# Patient Record
Sex: Male | Born: 1990 | Race: Black or African American | Hispanic: No | Marital: Married | Smoking: Current every day smoker
Health system: Southern US, Community
[De-identification: ages and names within clinical notes are randomized; demographics above are authoritative.]

---

## 2018-07-08 ENCOUNTER — Emergency Department (HOSPITAL_COMMUNITY): Payer: BLUE CROSS/BLUE SHIELD

## 2018-07-08 ENCOUNTER — Emergency Department (HOSPITAL_COMMUNITY)
Admission: EM | Admit: 2018-07-08 | Discharge: 2018-07-08 | Disposition: A | Discharge status: Home / Self Care | Payer: BLUE CROSS/BLUE SHIELD | Attending: Emergency Medicine | Admitting: Emergency Medicine

## 2018-07-08 ENCOUNTER — Encounter (HOSPITAL_COMMUNITY): Payer: Self-pay

## 2018-07-08 ENCOUNTER — Other Ambulatory Visit: Payer: Self-pay

## 2018-07-08 DIAGNOSIS — F17200 Nicotine dependence, unspecified, uncomplicated: Secondary | ICD-10-CM | POA: Insufficient documentation

## 2018-07-08 DIAGNOSIS — M79672 Pain in left foot: Secondary | ICD-10-CM | POA: Diagnosis not present

## 2018-07-08 MED ORDER — IBUPROFEN 200 MG PO TABS
600.0000 mg | ORAL_TABLET | Freq: Once | ORAL | Status: AC
  Administered 2018-07-08: 600 mg via ORAL
  Filled 2018-07-08: qty 3

## 2018-07-08 MED ORDER — IBUPROFEN 600 MG PO TABS: 600.0000 mg | ORAL_TABLET | Freq: Four times a day (QID) | ORAL | 0 refills | Status: DC | PRN

## 2018-07-08 NOTE — ED Notes (Signed)
Patient transported to X-ray 

## 2018-07-08 NOTE — ED Provider Notes (Signed)
[facility]-EMERGENCY DEPT Provider Note   CSN: [id number] Arrival date & time: 07/08/18  [id number]     History   Chief Complaint Chief Complaint  Patient presents with  . Foot Pain    HPI Aura CampsKyleith Buckholtz is a 27 y.o. male.  27 year old male presents with left foot pain localized to the top of his foot that is atraumatic.  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  Pain characterizes sharp and worse with any movement of his foot.  Denies any distal numbness or tingling to his toes.  No prior history of foot trauma.  Has used Aleve with temporary relief     History reviewed. No pertinent past medical history.  There are no active problems to display for this patient.   History reviewed. No pertinent surgical history.      Home Medications    Prior to Admission medications   Not on File    Family History No family history on file.  Social History Social History   Tobacco Use  . Smoking status: Current Every Day Smoker    Packs/day: 0.50  . Smokeless tobacco: Never Used  Substance Use Topics  . Alcohol use: Never    Frequency: Never  . Drug use: Yes    Types: Marijuana     Allergies   Kiwi extract and Nickel   Review of Systems Review of Systems  All other systems reviewed and are negative.    Physical Exam Updated Vital Signs BP 129/69 (BP Location: Left Arm)   Pulse 75   Temp 98 F (36.7 C) (Oral)   Resp 16   Ht 1.829 m (6')   Wt 83.9 kg   SpO2 98%   BMI 25.09 kg/m   Physical Exam  Constitutional: He is oriented to person, place, and time. He appears well-developed and well-nourished.  Non-toxic appearance. No distress.  HENT:  Head: Normocephalic and atraumatic.  Eyes: Pupils are equal, round, and reactive to light. Conjunctivae, EOM and lids are normal.  Neck: Normal range of motion. Neck supple. No tracheal deviation present. No thyroid mass present.  Cardiovascular: Normal rate, regular  rhythm and normal heart sounds. Exam reveals no gallop.  No murmur heard. Pulmonary/Chest: Effort normal and breath sounds normal. No stridor. No respiratory distress. He has no decreased breath sounds. He has no wheezes. He has no rhonchi. He has no rales.  Abdominal: Soft. Normal appearance and bowel sounds are normal. He exhibits no distension. There is no tenderness. There is no rebound and no CVA tenderness.  Musculoskeletal: Normal range of motion. He exhibits no edema or tenderness.       Left foot: There is bony tenderness. There is no swelling.       Feet:  Neurological: He is alert and oriented to person, place, and time. He has normal strength. No cranial nerve deficit or sensory deficit. GCS eye subscore is 4. GCS verbal subscore is 5. GCS motor subscore is 6.  Skin: Skin is warm and dry. No abrasion and no rash noted.  Psychiatric: He has a normal mood and affect. His speech is normal and behavior is normal.  Nursing note and vitals reviewed.    ED Treatments / Results  Labs (all labs ordered are listed, but only abnormal results are displayed) Labs Reviewed - No data to display  EKG None  Radiology No results found.  Procedures Procedures (including critical care time)  Medications Ordered in ED Medications  ibuprofen (ADVIL,MOTRIN)  tablet 600 mg (has no administration in time range)     Initial Impression / Assessment and Plan / ED Course  I have reviewed the triage vital signs and the nursing notes.  Pertinent labs & imaging results that were available during my care of the patient were reviewed by me and considered in my medical decision making (see chart for details).     Patient given Motrin and feels better.  X-rays negative stable for discharge  Final Clinical Impressions(s) / ED Diagnoses   Final diagnoses:  None    ED Discharge Orders    None       Lorre Nick, MD 07/08/18 [phone]

## 2018-07-08 NOTE — ED Triage Notes (Signed)
Pt states since yesterday after work, he has had left foot pain. Pt states he tried Aleve without relief. Pt states that he had no trauma or injury to foot. Pt states pain pain is on the top of his foot.

## 2018-07-08 NOTE — ED Notes (Signed)
Ice applied to left foot.

## 2018-10-17 ENCOUNTER — Ambulatory Visit: Payer: BLUE CROSS/BLUE SHIELD | Admitting: Family Medicine

## 2019-05-22 ENCOUNTER — Ambulatory Visit (INDEPENDENT_AMBULATORY_CARE_PROVIDER_SITE_OTHER): Payer: 59

## 2019-05-22 ENCOUNTER — Encounter (HOSPITAL_COMMUNITY): Payer: Self-pay | Admitting: Emergency Medicine

## 2019-05-22 ENCOUNTER — Other Ambulatory Visit: Payer: Self-pay

## 2019-05-22 ENCOUNTER — Ambulatory Visit (HOSPITAL_COMMUNITY)
Admission: EM | Admit: 2019-05-22 | Discharge: 2019-05-22 | Disposition: A | Discharge status: Home / Self Care | Payer: 59 | Attending: Emergency Medicine | Admitting: Emergency Medicine

## 2019-05-22 DIAGNOSIS — S29019A Strain of muscle and tendon of unspecified wall of thorax, initial encounter: Secondary | ICD-10-CM

## 2019-05-22 MED ORDER — TIZANIDINE HCL 4 MG PO TABS: 4.0000 mg | ORAL_TABLET | Freq: Three times a day (TID) | ORAL | 0 refills | Status: DC | PRN

## 2019-05-22 NOTE — ED Triage Notes (Signed)
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 Feels like he pulled a muscle.

## 2019-05-22 NOTE — ED Provider Notes (Signed)
HPI  SUBJECTIVE:  Joel Johnson is a 28 y.o. male who presents with dull, intermittent left lateral thoracic back pain that becomes sharp and stabbing with bending over, deep inspiration.  He reports shortness of breath secondary to the pain with inspiration.  Denies any other shortness of breath.  States the pain is present depending on his position/activity.  No trauma to the chest, coughing, wheezing, numbness or tingling in his arm.  No grip weakness.  No numbness or tingling extending forward onto his torso.  Symptoms are worse with inspiration, forward flexion of his left arm, no alleviating factors.  He has not tried anything for this.  Past medical history of back injury in this area from lifting weights.  No history of pneumothorax.  PMD: None.  History reviewed. No pertinent past medical history.  History reviewed. No pertinent surgical history.  History reviewed. No pertinent family history.  Social History   Tobacco Use  . Smoking status: Current Every Day Smoker    Packs/day: 0.50  . Smokeless tobacco: Never Used  Substance Use Topics  . Alcohol use: Never    Frequency: Never  . Drug use: Yes    Types: Marijuana    No current facility-administered medications for this encounter.   Current Outpatient Medications:  .  ibuprofen (ADVIL,MOTRIN) 600 MG tablet, Take 1 tablet (600 mg total) by mouth every 6 (six) hours as needed., Disp: 30 tablet, Rfl: 0 .  tiZANidine (ZANAFLEX) 4 MG tablet, Take 1 tablet (4 mg total) by mouth every 8 (eight) hours as needed for muscle spasms., Disp: 30 tablet, Rfl: 0  Allergies  Allergen Reactions  . Kiwi Extract Swelling  . Nickel Rash     ROS  As noted in HPI.   Physical Exam  BP (!) 150/90 (BP Location: Right Arm)   Pulse 95   Temp 98.6 F (37 C) (Oral)   Resp 16   SpO2 98%   Constitutional: Well developed, well nourished, no acute distress Eyes:  EOMI, conjunctiva normal bilaterally HENT: Normocephalic, atraumatic,mucus  membranes moist Respiratory: Limited inspiratory effort.  Lungs clear bilaterally.  Good air movement.  Positive tenderness over the latissimus dorsi at the lateral border of the scapula.  No scapular tenderness.  No C-spine, T-spine, L-spine tenderness.  No tenderness over the left trapezius. Cardiovascular: Normal rate GI: nondistended skin: No rash, skin intact Musculoskeletal: Patient has full AROM of the left shoulder.  Pain aggravated with forward flexion. Neurologic: Alert & oriented x 3, no focal neuro deficits Psychiatric: Speech and behavior appropriate   ED Course   Medications - No data to display  Orders Placed This Encounter  Procedures  . DG Chest 2 View    Standing Status:   Standing    Number of Occurrences:   1    Order Specific Question:   Reason for Exam (SYMPTOM  OR DIAGNOSIS REQUIRED)    Answer:   ro PTX    No results found for this or any previous visit (from the past 24 hour(s)). Dg Chest 2 View  Result Date: 05/22/2019 CLINICAL DATA:  Chest pain, evaluate for pneumothorax. EXAM: CHEST - 2 VIEW COMPARISON:  None. FINDINGS: No consolidation, features of edema, pneumothorax, or effusion. Cardiomediastinal contours are unremarkable. No acute osseous or soft tissue abnormality. IMPRESSION: No pneumothorax or other acute cardiopulmonary abnormality. Electronically Signed   By: Lovena Le M.D.   On: 05/22/2019 17:15    ED Clinical Impression  1. Thoracic myofascial strain, initial encounter  ED Assessment/Plan  Checking chest x-ray to rule out pneumothorax.  Otherwise if negative will treat as a thoracic sprain/strain, specifically latissimus dorsi injury.  Doubt scapular injury, rib fracture.  Plan to send home with ibuprofen 600 mg combined with 1 g of Tylenol 3-4 times a day as needed for pain, patient declined prescription of ibuprofen, also sent home with Zanaflex and a primary care list.  Reviewed imaging independently.  No pneumothorax.  See  radiology report for full details.  Plan as above.  Discussed imaging, MDM, treatment plan, and plan for follow-up with patient.patient agrees with plan.   Meds ordered this encounter  Medications  . tiZANidine (ZANAFLEX) 4 MG tablet    Sig: Take 1 tablet (4 mg total) by mouth every 8 (eight) hours as needed for muscle spasms.    Dispense:  30 tablet    Refill:  0    *This clinic note was created using Scientist, clinical (histocompatibility and immunogenetics)Dragon dictation software. Therefore, there may be occasional mistakes despite careful proofreading.   ?   Domenick GongMortenson, Harvey Lingo, MD 05/22/19 1723

## 2019-05-22 NOTE — Discharge Instructions (Addendum)
Your chest x-ray was negative for a collapsed lung.  Take 600 mg of ibuprofen combined with 1 g of Tylenol 3-4 times a day as needed for pain.  The Zanaflex as a muscle relaxant will also help.  Follow-up with a primary care physician of your choice, see list below.  Below is a list of primary care practices who are taking new patients for you to follow-up with.  [facility] at [facility] [address] [place], [place] [address] [phone]  [place], [place] [address] [phone]  [facility] [phone] [place] [address]  [facility]: [place]  [phone]  [facility]: [place]   [phone]  [facility]: [address] [place]  [phone]  [facility] [facility] : [address] [place] [phone]  [facility]: [address] [place] [address] [phone]  [facility]: [place] [phone]  Dr. Blanchie Serve [place]  [phone]  Dr. Benito Mccreedy, [facility]. [place], [place] [address]  [phone]  Go to www.goodrx.com to look up your medications. This will give you a list of where you can find your prescriptions at the most affordable prices. Or ask the pharmacist what the cash price is, or if they have any other discount programs available to help make your medication more affordable. This can be less expensive than what you would pay with insurance.

## 2019-08-29 ENCOUNTER — Telehealth: Payer: Self-pay

## 2019-08-29 ENCOUNTER — Ambulatory Visit: Payer: 59 | Admitting: Family Medicine

## 2019-08-29 NOTE — Telephone Encounter (Signed)

## 2019-08-30 ENCOUNTER — Ambulatory Visit (INDEPENDENT_AMBULATORY_CARE_PROVIDER_SITE_OTHER): Payer: 59 | Admitting: Family Medicine

## 2019-08-30 ENCOUNTER — Other Ambulatory Visit: Payer: Self-pay

## 2019-08-30 ENCOUNTER — Encounter: Payer: Self-pay | Admitting: Family Medicine

## 2019-08-30 VITALS — BP 100/70 | HR 113 | Temp 98.3°F | Ht 72.0 in | Wt 191.0 lb

## 2019-08-30 DIAGNOSIS — Z0000 Encounter for general adult medical examination without abnormal findings: Secondary | ICD-10-CM | POA: Insufficient documentation

## 2019-08-30 DIAGNOSIS — R61 Generalized hyperhidrosis: Secondary | ICD-10-CM | POA: Diagnosis not present

## 2019-08-30 DIAGNOSIS — N3943 Post-void dribbling: Secondary | ICD-10-CM | POA: Diagnosis not present

## 2019-08-30 DIAGNOSIS — Z720 Tobacco use: Secondary | ICD-10-CM

## 2019-08-30 DIAGNOSIS — Z630 Problems in relationship with spouse or partner: Secondary | ICD-10-CM | POA: Diagnosis not present

## 2019-08-30 DIAGNOSIS — R7989 Other specified abnormal findings of blood chemistry: Secondary | ICD-10-CM

## 2019-08-30 LAB — COMPREHENSIVE METABOLIC PANEL
ALT: 13 U/L (ref 0–53)
AST: 16 U/L (ref 0–37)
Albumin: 4.7 g/dL (ref 3.5–5.2)
Alkaline Phosphatase: 59 U/L (ref 39–117)
BUN: 16 mg/dL (ref 6–23)
CO2: 30 mEq/L (ref 19–32)
Calcium: 9.5 mg/dL (ref 8.4–10.5)
Chloride: 103 mEq/L (ref 96–112)
Creatinine, Ser: 1.11 mg/dL (ref 0.40–1.50)
GFR: 94.05 mL/min (ref >60.00)
Glucose, Bld: 88 mg/dL (ref 70–99)
Potassium: 4.8 mEq/L (ref 3.5–5.1)
Sodium: 139 mEq/L (ref 135–145)
Total Bilirubin: 0.9 mg/dL (ref 0.2–1.2)
Total Protein: 7 g/dL (ref 6.0–8.3)

## 2019-08-30 LAB — LIPID PANEL
Cholesterol: 126 mg/dL (ref 0–200)
HDL: 35.4 mg/dL — ABNORMAL LOW (ref >39.00)
LDL Cholesterol: 82 mg/dL (ref 0–99)
NonHDL: 90.44
Total CHOL/HDL Ratio: 4
Triglycerides: 44 mg/dL (ref 0.0–149.0)
VLDL: 8.8 mg/dL (ref 0.0–40.0)

## 2019-08-30 LAB — CBC
HCT: 44.5 % (ref 39.0–52.0)
Hemoglobin: 15 g/dL (ref 13.0–17.0)
MCHC: 33.6 g/dL (ref 30.0–36.0)
MCV: 96.2 fl (ref 78.0–100.0)
Platelets: 251 10*3/uL (ref 150.0–400.0)
RBC: 4.63 Mil/uL (ref 4.22–5.81)
RDW: 12.3 % (ref 11.5–15.5)
WBC: 6.4 10*3/uL (ref 4.0–10.5)

## 2019-08-30 LAB — URINALYSIS, ROUTINE W REFLEX MICROSCOPIC
Bilirubin Urine: NEGATIVE
Hgb urine dipstick: NEGATIVE
Ketones, ur: NEGATIVE
Leukocytes,Ua: NEGATIVE
Nitrite: NEGATIVE
Specific Gravity, Urine: 1.02 (ref 1.000–1.030)
Total Protein, Urine: NEGATIVE
Urine Glucose: NEGATIVE
Urobilinogen, UA: 1 (ref 0.0–1.0)
pH: 7 (ref 5.0–8.0)

## 2019-08-30 LAB — PSA: PSA: 0.12 ng/mL (ref 0.10–4.00)

## 2019-08-30 LAB — TSH: TSH: 0.17 u[IU]/mL — ABNORMAL LOW (ref 0.35–4.50)

## 2019-08-30 LAB — SEDIMENTATION RATE: Sed Rate: 5 mm/hr (ref 0–15)

## 2019-08-30 NOTE — Patient Instructions (Signed)
Preventive Care 48-30 Years Old, Male Preventive care refers to lifestyle choices and visits with your health care provider that can promote health and wellness. This includes:  A yearly physical exam. This is also called an annual well check.  Regular dental and eye exams.  Immunizations.  Screening for certain conditions.  Healthy lifestyle choices, such as eating a healthy diet, getting regular exercise, not using drugs or products that contain nicotine and tobacco, and limiting alcohol use. What can I expect for my preventive care visit? Physical exam Your health care provider will check:  Height and weight. These may be used to calculate body mass index (BMI), which is a measurement that tells if you are at a healthy weight.  Heart rate and blood pressure.  Your skin for abnormal spots. Counseling Your health care provider may ask you questions about:  Alcohol, tobacco, and drug use.  Emotional well-being.  Home and relationship well-being.  Sexual activity.  Eating habits.  Work and work Statistician. What immunizations do I need?  Influenza (flu) vaccine  This is recommended every year. Tetanus, diphtheria, and pertussis (Tdap) vaccine  You may need a Td booster every 10 years. Varicella (chickenpox) vaccine  You may need this vaccine if you have not already been vaccinated. Human papillomavirus (HPV) vaccine  If recommended by your health care provider, you may need three doses over 6 months. Measles, mumps, and rubella (MMR) vaccine  You may need at least one dose of MMR. You may also need a second dose. Meningococcal conjugate (MenACWY) vaccine  One dose is recommended if you are 47-13 years old and a Market researcher living in a residence [place], or if you have one of several medical conditions. You may also need additional booster doses. Pneumococcal conjugate (PCV13) vaccine  You may need this if you have certain conditions and were not  previously vaccinated. Pneumococcal polysaccharide (PPSV23) vaccine  You may need one or two doses if you smoke cigarettes or if you have certain conditions. Hepatitis A vaccine  You may need this if you have certain conditions or if you travel or work in places where you may be exposed to hepatitis A. Hepatitis B vaccine  You may need this if you have certain conditions or if you travel or work in places where you may be exposed to hepatitis B. Haemophilus influenzae type b (Hib) vaccine  You may need this if you have certain risk factors. You may receive vaccines as individual doses or as more than one vaccine together in one shot (combination vaccines). Talk with your health care provider about the risks and benefits of combination vaccines. What tests do I need? Blood tests  Lipid and cholesterol levels. These may be checked every 5 years starting at age 5.  Hepatitis C test.  Hepatitis B test. Screening   Diabetes screening. This is done by checking your blood sugar (glucose) after you have not eaten for a while (fasting).  Sexually transmitted disease (STD) testing. Talk with your health care provider about your test results, treatment options, and if necessary, the need for more tests. Follow these instructions at home: Eating and drinking   Eat a diet that includes fresh fruits and vegetables, whole grains, lean protein, and low-fat dairy products.  Take vitamin and mineral supplements as recommended by your health care provider.  Do not drink alcohol if your health care provider tells you not to drink.  If you drink alcohol: ? Limit how much you have to 0-2  drinks a day. ? Be aware of how much alcohol is in your drink. In the [place] one drink equals one 12 oz bottle of beer (355 mL), one 5 oz glass of wine (148 mL), or one 1 oz glass of hard liquor (44 mL). Lifestyle  Take daily care of your teeth and gums.  Stay active. Exercise for at least 30 minutes on 5 or  more days each week.  Do not use any products that contain nicotine or tobacco, such as cigarettes, e-cigarettes, and chewing tobacco. If you need help quitting, ask your health care provider.  If you are sexually active, practice safe sex. Use a condom or other form of protection to prevent STIs (sexually transmitted infections). What's next?  Go to your health care provider once a year for a well check visit.  Ask your health care provider how often you should have your eyes and teeth checked.  Stay up to date on all vaccines. This information is not intended to replace advice given to you by your health care provider. Make sure you discuss any questions you have with your health care provider. Document Released: 12/21/2001 Document Revised: 10/19/2018 Document Reviewed: 10/19/2018 Elsevier Patient Education  2020 [place] Maintenance, Male Adopting a healthy lifestyle and getting preventive care are important in promoting health and wellness. Ask your health care provider about:  The right schedule for you to have regular tests and exams.  Things you can do on your own to prevent diseases and keep yourself healthy. What should I know about diet, weight, and exercise? Eat a healthy diet   Eat a diet that includes plenty of vegetables, fruits, low-fat dairy products, and lean protein.  Do not eat a lot of foods that are high in solid fats, added sugars, or sodium. Maintain a healthy weight Body mass index (BMI) is a measurement that can be used to identify possible weight problems. It estimates body fat based on height and weight. Your health care provider can help determine your BMI and help you achieve or maintain a healthy weight. Get regular exercise Get regular exercise. This is one of the most important things you can do for your health. Most adults should:  Exercise for at least 150 minutes each week. The exercise should increase your heart rate and make you  sweat (moderate-intensity exercise).  Do strengthening exercises at least twice a week. This is in addition to the moderate-intensity exercise.  Spend less time sitting. Even light physical activity can be beneficial. Watch cholesterol and blood lipids Have your blood tested for lipids and cholesterol at 30 years of age, then have this test every 5 years. You may need to have your cholesterol levels checked more often if:  Your lipid or cholesterol levels are high.  You are older than 30 years of age.  You are at high risk for heart disease. What should I know about cancer screening? Many types of cancers can be detected early and may often be prevented. Depending on your health history and family history, you may need to have cancer screening at various ages. This may include screening for:  Colorectal cancer.  Prostate cancer.  Skin cancer.  Lung cancer. What should I know about heart disease, diabetes, and high blood pressure? Blood pressure and heart disease  High blood pressure causes heart disease and increases the risk of stroke. This is more likely to develop in people who have high blood pressure readings, are of African descent, or are  overweight.  Talk with your health care provider about your target blood pressure readings.  Have your blood pressure checked: ? Every 3-5 years if you are 58-109 years of age. ? Every year if you are 60 years old or older.  If you are between the ages of 59 and 2 and are a current or former smoker, ask your health care provider if you should have a one-time screening for abdominal aortic aneurysm (AAA). Diabetes Have regular diabetes screenings. This checks your fasting blood sugar level. Have the screening done:  Once every three years after age 21 if you are at a normal weight and have a low risk for diabetes.  More often and at a younger age if you are overweight or have a high risk for diabetes. What should I know about  preventing infection? Hepatitis B If you have a higher risk for hepatitis B, you should be screened for this virus. Talk with your health care provider to find out if you are at risk for hepatitis B infection. Hepatitis C Blood testing is recommended for:  Everyone born from [birth year] through 1967.  Anyone with known risk factors for hepatitis C. Sexually transmitted infections (STIs)  You should be screened each year for STIs, including gonorrhea and chlamydia, if: ? You are sexually active and are younger than 30 years of age. ? You are older than 30 years of age and your health care provider tells you that you are at risk for this type of infection. ? Your sexual activity has changed since you were last screened, and you are at increased risk for chlamydia or gonorrhea. Ask your health care provider if you are at risk.  Ask your health care provider about whether you are at high risk for HIV. Your health care provider may recommend a prescription medicine to help prevent HIV infection. If you choose to take medicine to prevent HIV, you should first get tested for HIV. You should then be tested every 3 months for as long as you are taking the medicine. Follow these instructions at home: Lifestyle  Do not use any products that contain nicotine or tobacco, such as cigarettes, e-cigarettes, and chewing tobacco. If you need help quitting, ask your health care provider.  Do not use street drugs.  Do not share needles.  Ask your health care provider for help if you need support or information about quitting drugs. Alcohol use  Do not drink alcohol if your health care provider tells you not to drink.  If you drink alcohol: ? Limit how much you have to 0-2 drinks a day. ? Be aware of how much alcohol is in your drink. In the [place] one drink equals one 12 oz bottle of beer (355 mL), one 5 oz glass of wine (148 mL), or one 1 oz glass of hard liquor (44 mL). General instructions  Schedule  regular health, dental, and eye exams.  Stay current with your vaccines.  Tell your health care provider if: ? You often feel depressed. ? You have ever been abused or do not feel safe at home. Summary  Adopting a healthy lifestyle and getting preventive care are important in promoting health and wellness.  Follow your health care provider's instructions about healthy diet, exercising, and getting tested or screened for diseases.  Follow your health care provider's instructions on monitoring your cholesterol and blood pressure. This information is not intended to replace advice given to you by your health care provider. Make sure you discuss  any questions you have with your health care provider. Document Released: 04/22/2008 Document Revised: 10/18/2018 Document Reviewed: 10/18/2018 Elsevier Patient Education  2020 [place] is a normal reaction to life events. Stress is what you feel when life demands more than you are used to, or more than you think you can handle. Some stress can be useful, such as studying for a test or meeting a deadline at work. Stress that occurs too often or for too long can cause problems. It can affect your emotional health and interfere with relationships and normal daily activities. Too much stress can weaken your body's defense system (immune system) and increase your risk for physical illness. If you already have a medical problem, stress can make it worse. What are the causes? All sorts of life events can cause stress. An event that causes stress for one person may not be stressful for another person. Major life events, whether positive or negative, commonly cause stress. Examples include:  Losing a job or starting a new job.  Losing a loved one.  Moving to a new town or home.  Getting married or divorced.  Having a baby.  Injury or illness. Less obvious life events can also cause stress, especially if they occur day after day or  in combination with each other. Examples include:  Working long hours.  Driving in traffic.  Caring for children.  Being in debt.  Being in a difficult relationship. What are the signs or symptoms? Stress can cause emotional symptoms, including:  Anxiety. This is feeling worried, afraid, on edge, overwhelmed, or out of control.  Anger, including irritation or impatience.  Depression. This is feeling sad, down, helpless, or guilty.  Trouble focusing, remembering, or making decisions. Stress can cause physical symptoms, including:  Aches and pains. These may affect your head, neck, back, stomach, or other areas of your body.  Tight muscles or a clenched jaw.  Low energy.  Trouble sleeping. Stress can cause unhealthy behaviors, including:  Eating to feel better (overeating) or skipping meals.  Working too much or putting off tasks.  Smoking, drinking alcohol, or using drugs to feel better. How is this diagnosed? Stress is diagnosed through an assessment by your health care provider. He or she may diagnose this condition based on:  Your symptoms and any stressful life events.  Your medical history.  Tests to rule out other causes of your symptoms. Depending on your condition, your health care provider may refer you to a specialist for further evaluation. How is this treated?  Stress management techniques are the recommended treatment for stress. Medicine is not typically recommended for the treatment of stress. Techniques to reduce your reaction to stressful life events include:  Stress identification. Monitor yourself for symptoms of stress and identify what causes stress for you. These skills may help you to avoid or prepare for stressful events.  Time management. Set your priorities, keep a calendar of events, and learn to say no. Taking these actions can help you avoid making too many commitments. Techniques for coping with stress include:  Rethinking the  problem. Try to think realistically about stressful events rather than ignoring them or overreacting. Try to find the positives in a stressful situation rather than focusing on the negatives.  Exercise. Physical exercise can release both physical and emotional tension. The key is to find a form of exercise that you enjoy and do it regularly.  Relaxation techniques. These relax the body and mind. The key is to  find one or more that you enjoy and use the technique(s) regularly. Examples include: ? Meditation, deep breathing, or progressive relaxation techniques. ? Yoga or tai chi. ? Biofeedback, mindfulness techniques, or journaling. ? Listening to music, being out in nature, or participating in other hobbies.  Practicing a healthy lifestyle. Eat a balanced diet, drink plenty of water, limit or avoid caffeine, and get plenty of sleep.  Having a strong support network. Spend time with family, friends, or other people you enjoy being around. Express your feelings and talk things over with someone you trust. Counseling or talk therapy with a mental health professional may be helpful if you are having trouble managing stress on your own. Follow these instructions at home: Lifestyle   Avoid drugs.  Do not use any products that contain nicotine or tobacco, such as cigarettes and e-cigarettes. If you need help quitting, ask your health care provider.  Limit alcohol intake to no more than 1 drink a day for nonpregnant women and 2 drinks a day for men. One drink equals 12 oz of beer, 5 oz of wine, or 1 oz of hard liquor.  Do not use alcohol or drugs to relax.  Eat a balanced diet that includes fresh fruits and vegetables, whole grains, lean meats, fish, eggs, and beans, and low-fat dairy. Avoid processed foods and foods high in added fat, sugar, and salt.  Exercise at least 30 minutes on 5 or more days each week.  Get 7-8 hours of sleep each night. General instructions   Practice stress  management techniques as discussed with your health care provider.  Drink enough fluid to keep your urine clear or pale yellow.  Take over-the-counter and prescription medicines only as told by your health care provider.  Keep all follow-up visits as told by your health care provider. This is important. Contact a health care provider if:  Your symptoms get worse.  You have new symptoms.  You feel overwhelmed by your problems and can no longer manage them on your own. Get help right away if:  You have thoughts of hurting yourself or others. If you ever feel like you may hurt yourself or others, or have thoughts about taking your own life, get help right away. You can go to your nearest emergency department or call:  Your local emergency services (911 in the [place]  A suicide crisis helpline, such as the [place] at 862-074-9878. This is open 24 hours a day. Summary  Stress is a normal reaction to life events. It can cause problems if it happens too often or for too long.  Practicing stress management techniques is the best way to treat stress.  Counseling or talk therapy with a mental health professional may be helpful if you are having trouble managing stress on your own. This information is not intended to replace advice given to you by your health care provider. Make sure you discuss any questions you have with your health care provider. Document Released: 04/20/2001 Document Revised: 10/07/2017 Document Reviewed: 12/15/2016 Elsevier Patient Education  [place].  Mindfulness-Based Stress Reduction Mindfulness-based stress reduction (MBSR) is a program that helps people learn to practice mindfulness. Mindfulness is the practice of intentionally paying attention to the present moment. It can be learned and practiced through techniques such as education, breathing exercises, meditation, and yoga. MBSR includes several mindfulness techniques in  one program. MBSR works best when you understand the treatment, are willing to try new things, and can commit to  spending time practicing what you learn. MBSR training may include learning about:  How your emotions, thoughts, and reactions affect your body.  New ways to respond to things that cause negative thoughts to start (triggers).  How to notice your thoughts and let go of them.  Practicing awareness of everyday things that you normally do without thinking.  The techniques and goals of different types of meditation. What are the benefits of MBSR? MBSR can have many benefits, which include helping you to:  Develop self-awareness. This refers to knowing and understanding yourself.  Learn skills and attitudes that help you to participate in your own health care.  Learn new ways to care for yourself.  Be more accepting about how things are, and let things go.  Be less judgmental and approach things with an open mind.  Be patient with yourself and trust yourself more. MBSR has also been shown to:  Reduce negative emotions, such as depression and anxiety.  Improve memory and focus.  Change how you sense and approach pain.  Boost your body's ability to fight infections.  Help you connect better with other people.  Improve your sense of well-being. Follow these instructions at home:   Find a local in-person or online MBSR program.  Set aside some time regularly for mindfulness practice.  Find a mindfulness practice that works best for you. This may include one or more of the following: ? Meditation. Meditation involves focusing your mind on a certain thought or activity. ? Breathing awareness exercises. These help you to stay present by focusing on your breath. ? Body scan. For this practice, you lie down and pay attention to each part of your body from head to toe. You can identify tension and soreness and intentionally relax parts of your body. ? Yoga. Yoga involves  stretching and breathing, and it can improve your ability to move and be flexible. It can also provide an experience of testing your body's limits, which can help you release stress. ? Mindful eating. This way of eating involves focusing on the taste, texture, color, and smell of each bite of food. Because this slows down eating and helps you feel full sooner, it can be an important part of a weight-loss plan.  Find a podcast or recording that provides guidance for breathing awareness, body scan, or meditation exercises. You can listen to these any time when you have a free moment to rest without distractions.  Follow your treatment plan as told by your health care provider. This may include taking regular medicines and making changes to your diet or lifestyle as recommended. How to practice mindfulness To do a basic awareness exercise:  Find a comfortable place to sit.  Pay attention to the present moment. Observe your thoughts, feelings, and surroundings just as they are.  Avoid placing judgment on yourself, your feelings, or your surroundings. Make note of any judgment that comes up, and let it go.  Your mind may wander, and that is okay. Make note of when your thoughts drift, and return your attention to the present moment. To do basic mindfulness meditation:  Find a comfortable place to sit. This may include a stable chair or a firm floor cushion. ? Sit upright with your back straight. Let your arms fall next to your side with your hands resting on your legs. ? If sitting in a chair, rest your feet flat on the floor. ? If sitting on a cushion, cross your legs in front of you.  Keep  your head in a neutral position with your chin dropped slightly. Relax your jaw and rest the tip of your tongue on the roof of your mouth. Drop your gaze to the floor. You can close your eyes if you like.  Breathe normally and pay attention to your breath. Feel the air moving in and out of your nose. Feel  your belly expanding and relaxing with each breath.  Your mind may wander, and that is okay. Make note of when your thoughts drift, and return your attention to your breath.  Avoid placing judgment on yourself, your feelings, or your surroundings. Make note of any judgment or feelings that come up, let them go, and bring your attention back to your breath.  When you are ready, lift your gaze or open your eyes. Pay attention to how your body feels after the meditation. Where to find more information You can find more information about MBSR from:  Your health care provider.  Community-based meditation centers or programs.  Programs offered near you. Summary  Mindfulness-based stress reduction (MBSR) is a program that teaches you how to intentionally pay attention to the present moment. It is used with other treatments to help you cope better with daily stress, emotions, and pain.  MBSR focuses on developing self-awareness, which allows you to respond to life stress without judgment or negative emotions.  MBSR programs may involve learning different mindfulness practices, such as breathing exercises, meditation, yoga, body scan, or mindful eating. Find a mindfulness practice that works best for you, and set aside time for it on a regular basis. This information is not intended to replace advice given to you by your health care provider. Make sure you discuss any questions you have with your health care provider. Document Released: 03/03/2017 Document Revised: 10/07/2017 Document Reviewed: 03/03/2017 Elsevier Patient Education  2020 [employer].

## 2019-08-30 NOTE — Progress Notes (Addendum)
Established Patient Office Visit  Subjective:  Patient ID: Joel Johnson, male    DOB: [birth year]  Age: 28 y.o. MRN: [id number]  CC:  Chief Complaint  Patient presents with  . Establish Care  . Annual Exam    HPI Joel Johnson presents for establishment of care and a complete physical exam.  Patient is here essentially at his wife's behest.  She is concerned about his night sweats and post void dribbling.  She feels as though he needs to get his prostate examined and have blood work done as well.  Patient is healthy as far as he knows.  He has not been losing weight with his night sweats.  He does smoke.  He does not drink alcohol at this time but he is smoking marijuana on occasion.  He works at a [employer].  He has not been able to see the dentist this year.  Patient denies prevoid dribbling, nocturia and decrease in the force of his stream.  Patient tells of a lot of marital discord and stress.  Feels as though his wife is controlling and demanding.  He is in the process of regaining custody of his 70-year-old son.  He had served in [employer] and was discharged 3 years ago.  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  Chart review does show recent normal chest x-ray.  History reviewed. No pertinent past medical history.  History reviewed. No pertinent surgical history.  History reviewed. No pertinent family history.  Social History   Socioeconomic History  . Marital status: Married    Spouse name: Not on file  . Number of children: Not on file  . Years of education: Not on file  . Highest education level: Not on file  Occupational History  . Not on file  Social Needs  . Financial resource strain: Not on file  . Food insecurity    Worry: Not on file    Inability: Not on file  . Transportation needs    Medical: Not on file    Non-medical: Not on file  Tobacco Use  . Smoking status: Current Every Day Smoker    Packs/day: 0.50  . Smokeless tobacco: Never  Used  Substance and Sexual Activity  . Alcohol use: Never    Frequency: Never  . Drug use: Yes    Types: Marijuana  . Sexual activity: Not on file  Lifestyle  . Physical activity    Days per week: Not on file    Minutes per session: Not on file  . Stress: Not on file  Relationships  . Social Musician on phone: Not on file    Gets together: Not on file    Attends religious service: Not on file    Active member of club or organization: Not on file    Attends meetings of clubs or organizations: Not on file    Relationship status: Not on file  . Intimate partner violence    Fear of current or ex partner: Not on file    Emotionally abused: Not on file    Physically abused: Not on file    Forced sexual activity: Not on file  Other Topics Concern  . Not on file  Social History Narrative  . Not on file    Outpatient Medications Prior to Visit  Medication Sig Dispense Refill  . ibuprofen (ADVIL,MOTRIN) 600 MG tablet Take 1 tablet (600 mg total) by mouth every 6 (six) hours as needed. 30  tablet 0  . tiZANidine (ZANAFLEX) 4 MG tablet Take 1 tablet (4 mg total) by mouth every 8 (eight) hours as needed for muscle spasms. 30 tablet 0   No facility-administered medications prior to visit.     Allergies  Allergen Reactions  . Kiwi Extract Swelling  . Nickel Rash    ROS Review of Systems  Constitutional: Negative for chills, diaphoresis, fatigue, fever and unexpected weight change.  HENT: Positive for dental problem.   Eyes: Negative for photophobia and visual disturbance.  Respiratory: Negative.   Cardiovascular: Negative.   Gastrointestinal: Negative.   Endocrine: Negative for polyphagia and polyuria.  Genitourinary: Positive for difficulty urinating. Negative for dysuria, enuresis, frequency and urgency.  Musculoskeletal: Negative for arthralgias, gait problem, joint swelling and myalgias.  Skin: Positive for rash.  Allergic/Immunologic: Negative for  immunocompromised state.  Neurological: Negative for light-headedness, numbness and headaches.  Hematological: Does not bruise/bleed easily.  Psychiatric/Behavioral: The patient is nervous/anxious.       Objective:    Physical Exam  Constitutional: He is oriented to person, place, and time. He appears well-developed and well-nourished. No distress.  HENT:  Head: Normocephalic and atraumatic.  Right Ear: External ear normal.  Left Ear: External ear normal.  Mouth/Throat: Oropharynx is clear and moist. Abnormal dentition. Dental caries present. No dental abscesses. No oropharyngeal exudate.  Eyes: Pupils are equal, round, and reactive to light. Conjunctivae are normal. Right eye exhibits no discharge. Left eye exhibits no discharge. No scleral icterus.  Neck: No JVD present. No tracheal deviation present.  Cardiovascular: Normal rate, regular rhythm and normal heart sounds.  Pulmonary/Chest: Effort normal and breath sounds normal. No stridor.  Abdominal: Soft. Bowel sounds are normal. He exhibits no distension. There is no abdominal tenderness. There is no rebound and no guarding. Hernia confirmed negative in the right inguinal area and confirmed negative in the left inguinal area.  Genitourinary: Rectum:     Guaiac result negative.     No rectal mass, anal fissure, tenderness, external hemorrhoid, internal hemorrhoid or abnormal anal tone.  Prostate is not enlarged and not tender. Right testis shows no mass, no swelling and no tenderness. Right testis is descended. Left testis shows no mass, no swelling and no tenderness. Left testis is descended. Uncircumcised. No phimosis, paraphimosis, hypospadias, penile erythema or penile tenderness. No discharge found.  Musculoskeletal: Normal range of motion.        General: No edema.  Lymphadenopathy:       Right: No inguinal adenopathy present.       Left: No inguinal adenopathy present.  Neurological: He is alert and oriented to person, place,  and time.  Skin: Skin is warm and dry. He is not diaphoretic.  Psychiatric: He has a normal mood and affect. His behavior is normal.    BP 100/70   Pulse (!) 113   Temp 98.3 F (36.8 C) (Tympanic)   Ht 6' (1.829 m)   Wt 191 lb (86.6 kg)   SpO2 98%   BMI 25.90 kg/m  Wt Readings from Last 3 Encounters:  08/30/19 191 lb (86.6 kg)  07/08/18 185 lb (83.9 kg)   BP Readings from Last 3 Encounters:  08/30/19 100/70  05/22/19 (!) 150/90  07/08/18 121/74   Guideline developer:  UpToDate (see UpToDate for funding source) Date Released: June 2014  Health Maintenance Due  Topic Date Due  . HIV Screening  07/22/2004  . TETANUS/TDAP  07/22/2008  . INFLUENZA VACCINE  06/09/2019    There are no  preventive care reminders to display for this patient.  Lab Results  Component Value Date   TSH 0.17 (L) 08/30/2019   Lab Results  Component Value Date   WBC 6.4 08/30/2019   HGB 15.0 08/30/2019   HCT 44.5 08/30/2019   MCV 96.2 08/30/2019   PLT 251.0 08/30/2019   Lab Results  Component Value Date   NA 139 08/30/2019   K 4.8 08/30/2019   CO2 30 08/30/2019   GLUCOSE 88 08/30/2019   BUN 16 08/30/2019   CREATININE 1.11 08/30/2019   BILITOT 0.9 08/30/2019   ALKPHOS 59 08/30/2019   AST 16 08/30/2019   ALT 13 08/30/2019   PROT 7.0 08/30/2019   ALBUMIN 4.7 08/30/2019   CALCIUM 9.5 08/30/2019   GFR 94.05 08/30/2019   Lab Results  Component Value Date   CHOL 126 08/30/2019   Lab Results  Component Value Date   HDL 35.40 (L) 08/30/2019   Lab Results  Component Value Date   LDLCALC 82 08/30/2019   Lab Results  Component Value Date   TRIG 44.0 08/30/2019   Lab Results  Component Value Date   CHOLHDL 4 08/30/2019   No results found for: HGBA1C    Assessment & Plan:   Problem List Items Addressed This Visit      Other   Stress due to marital problems   Relevant Orders   Ambulatory referral to Psychology   Health care maintenance - Primary   Relevant Orders   CBC  (Completed)   Comprehensive metabolic panel (Completed)   Lipid panel (Completed)   HIV Antibody (routine testing w rflx) (Completed)   TSH (Completed)   Urinalysis, Routine w reflex microscopic (Completed)   Night sweat   Relevant Orders   HIV Antibody (routine testing w rflx) (Completed)   TSH (Completed)   Sedimentation rate (Completed)   Post-void dribbling   Relevant Orders   PSA (Completed)   Tobacco use   Low TSH level   Relevant Orders   T3, free   T4      No orders of the defined types were placed in this encounter.   Follow-up: Return in about 3 months (around 11/30/2019).   Patient was given information on health maintenance and disease prevention.  Encouraged him to see a dentist.  Agrees to go for counseling for his marital issues.  Believe that stress is the probable etiology of his night sweats.  Blood work is pending.  Asked him to follow-up in 3 months to check his progress.

## 2019-08-31 DIAGNOSIS — R7989 Other specified abnormal findings of blood chemistry: Secondary | ICD-10-CM | POA: Insufficient documentation

## 2019-08-31 LAB — HIV ANTIBODY (ROUTINE TESTING W REFLEX): HIV 1&2 Ab, 4th Generation: NONREACTIVE

## 2019-08-31 NOTE — Addendum Note (Signed)
Addended by: Jon Billings on: 08/31/2019 01:46 PM   Modules accepted: Orders

## 2019-09-07 ENCOUNTER — Other Ambulatory Visit: Payer: Self-pay

## 2019-09-07 ENCOUNTER — Other Ambulatory Visit (INDEPENDENT_AMBULATORY_CARE_PROVIDER_SITE_OTHER): Payer: 59

## 2019-09-07 DIAGNOSIS — R7989 Other specified abnormal findings of blood chemistry: Secondary | ICD-10-CM | POA: Diagnosis not present

## 2019-09-07 LAB — T3, FREE: T3, Free: 3.6 pg/mL (ref 2.3–4.2)

## 2019-09-08 LAB — T4: T4, Total: 9 ug/dL (ref 4.9–10.5)

## 2020-06-30 IMAGING — CR DG FOOT COMPLETE 3+V*L*
3 series · 3 of 3 positions shown · non-contrast
Comparison: None.

CLINICAL DATA: Acute onset left foot pain and swelling. No known
injury.

EXAM:
LEFT FOOT - COMPLETE 3+ VIEW

[x foot ap left]
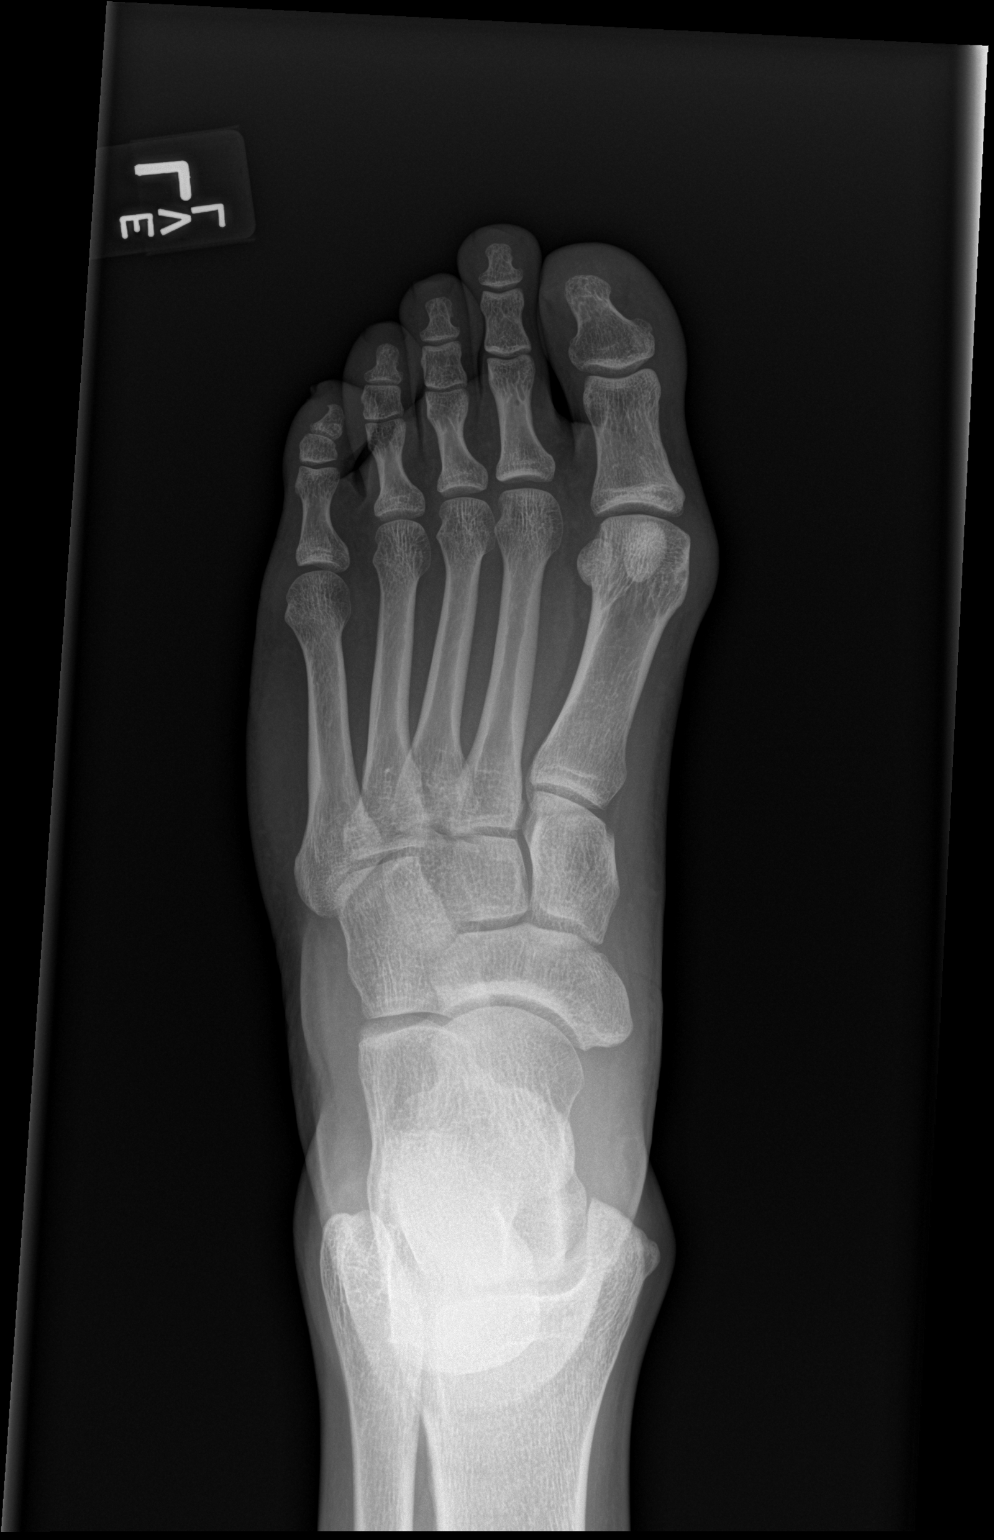

[x foot obl left]
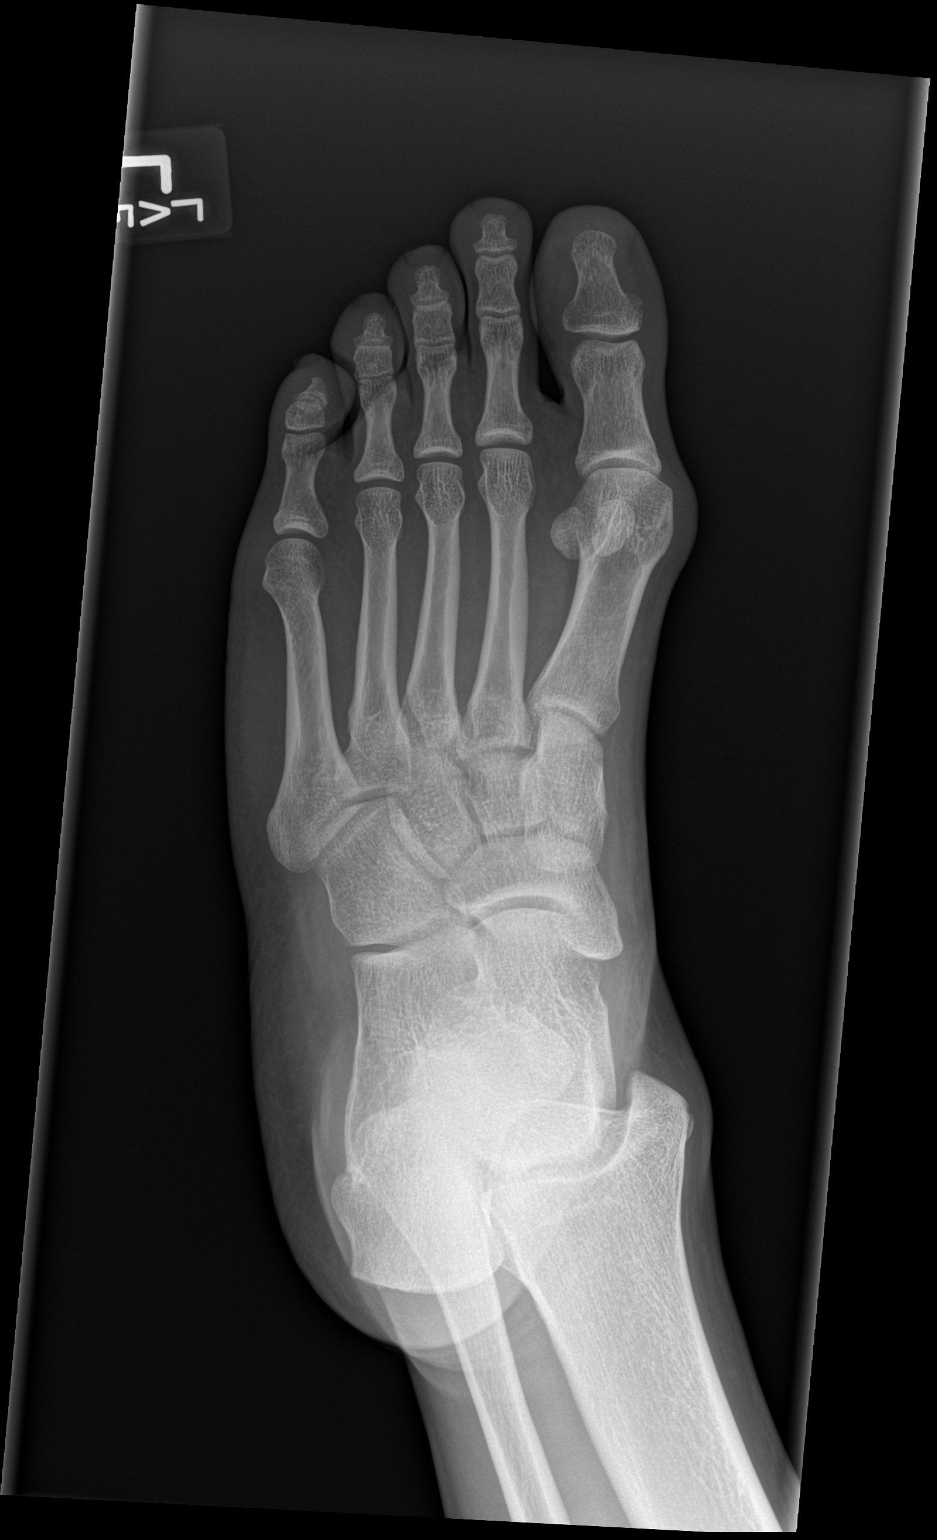

[x foot lat left]
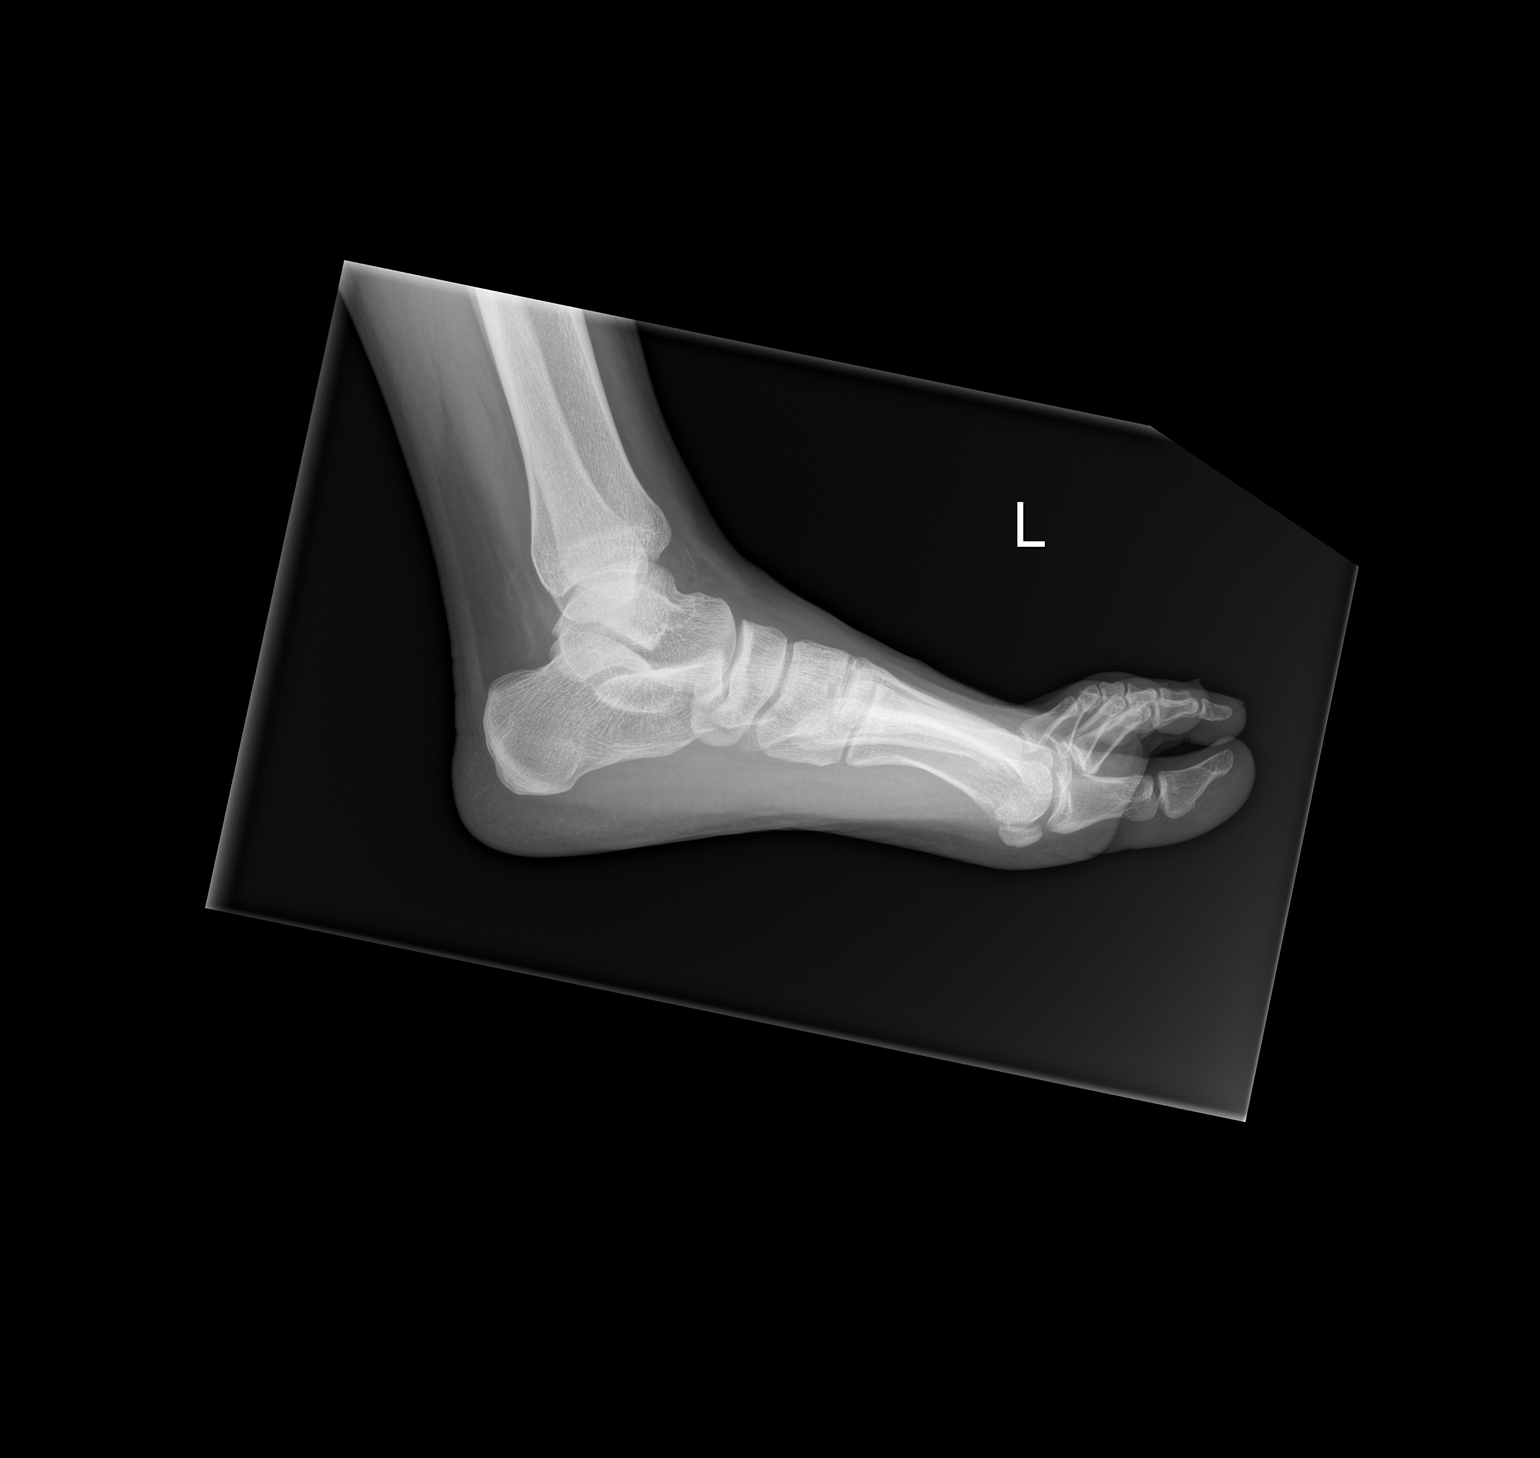

[3 of 3 positions shown; findings below may reference images not displayed]

FINDINGS: There is no evidence of fracture or dislocation. There is no
evidence of arthropathy or other focal bone abnormality. Soft
tissues are unremarkable.
IMPRESSION: Negative.

## 2021-05-14 IMAGING — DX CHEST - 2 VIEW
2 series · 2 of 2 positions shown · non-contrast
Comparison: None.

CLINICAL DATA: Chest pain, evaluate for pneumothorax.

EXAM:
CHEST - 2 VIEW

[chest pa]
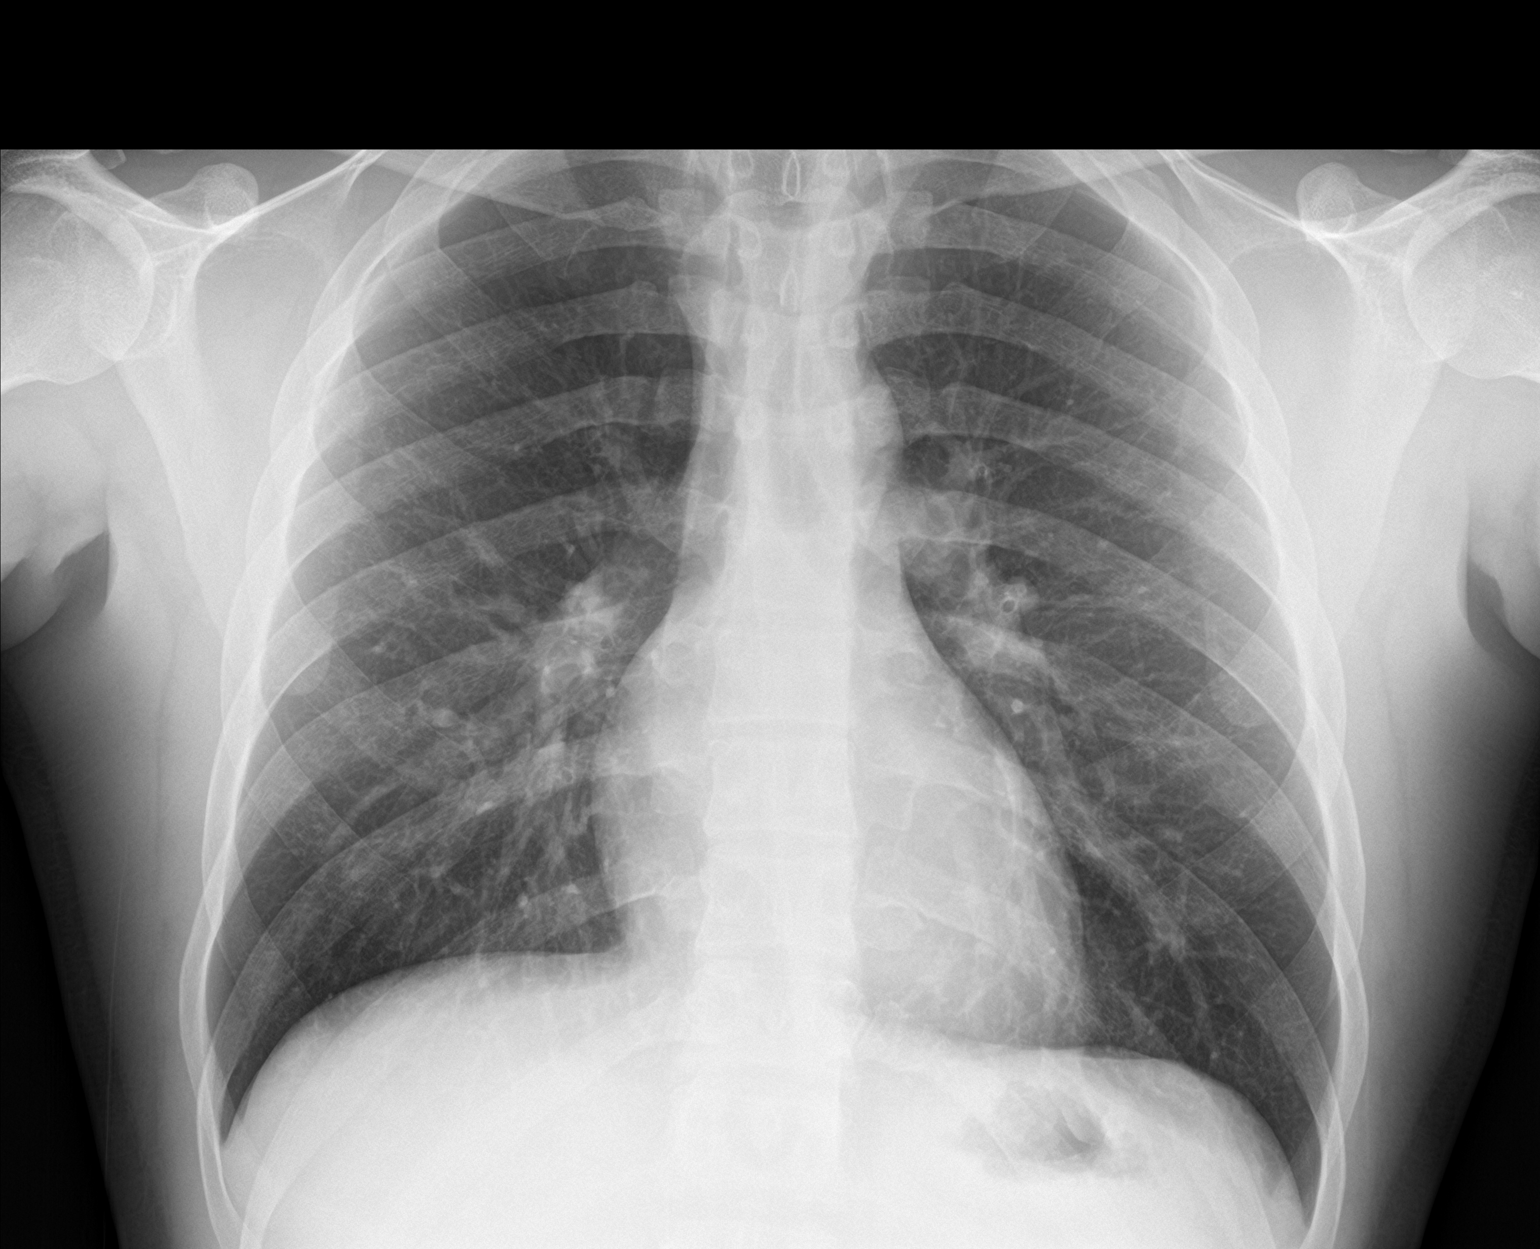

[chest lat]
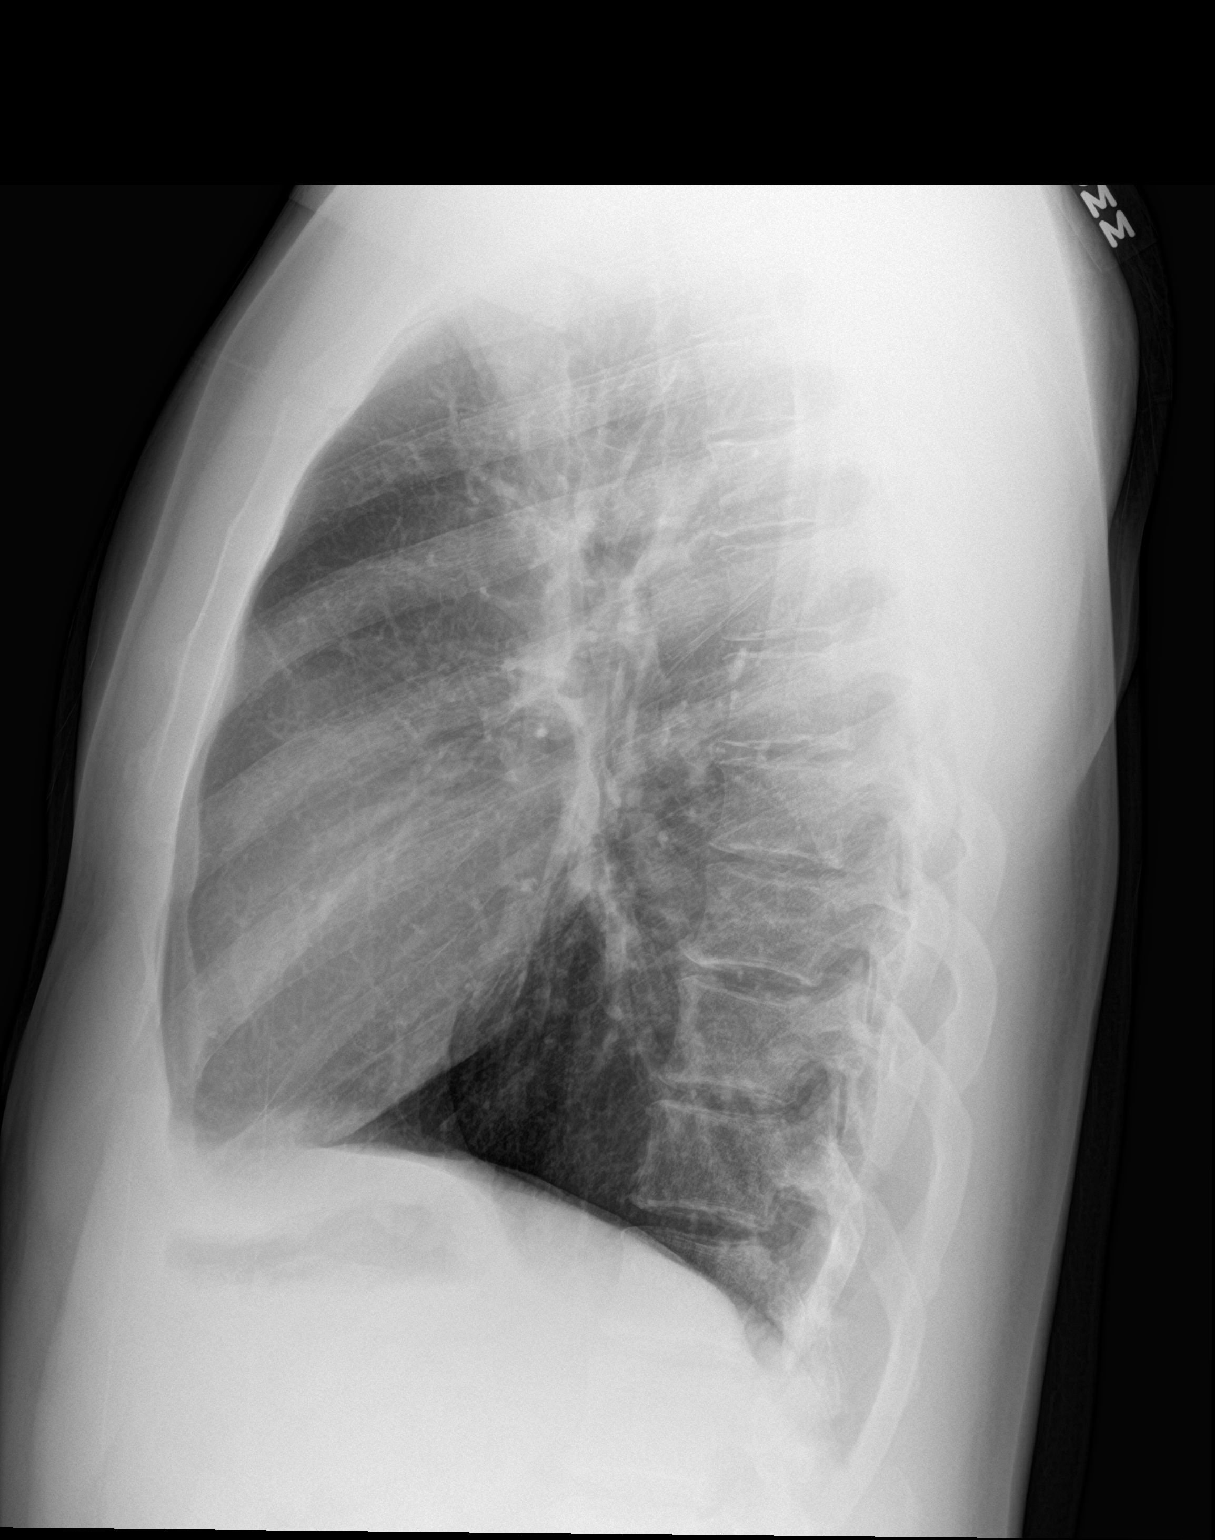

[2 of 2 positions shown; findings below may reference images not displayed]

FINDINGS: No consolidation, features of edema, pneumothorax, or effusion.
Cardiomediastinal contours are unremarkable. No acute osseous or
soft tissue abnormality.
IMPRESSION: No pneumothorax or other acute cardiopulmonary abnormality.

## 2024-02-09 ENCOUNTER — Emergency Department (HOSPITAL_COMMUNITY)

## 2024-02-09 ENCOUNTER — Emergency Department (HOSPITAL_COMMUNITY)
Admission: EM | Admit: 2024-02-09 | Discharge: 2024-02-09 | Disposition: A | Discharge status: Home / Self Care | Attending: Emergency Medicine | Admitting: Emergency Medicine

## 2024-02-09 DIAGNOSIS — M79672 Pain in left foot: Secondary | ICD-10-CM | POA: Insufficient documentation

## 2024-02-09 DIAGNOSIS — F17200 Nicotine dependence, unspecified, uncomplicated: Secondary | ICD-10-CM | POA: Insufficient documentation

## 2024-02-09 MED ORDER — IBUPROFEN 600 MG PO TABS: 600.0000 mg | ORAL_TABLET | Freq: Four times a day (QID) | ORAL | 0 refills | Status: AC | PRN

## 2024-02-09 NOTE — ED Triage Notes (Signed)
 Pt c/o L foot pain for two days. Denies known injury, states that pain started after getting off of work one day.

## 2024-02-09 NOTE — ED Provider Notes (Signed)
 [place] EMERGENCY DEPARTMENT AT [facility] Provider Note   CSN: [id number] Arrival date & time: 02/09/24  1000     History Chief Complaint  Patient presents with   Foot Pain    Joel Johnson is a 33 y.o. 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  Patient reports has been hurting him for the past 3 to 4 days.  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  He reports he feels the pain mainly in his arch of his foot.  Denies any leg swelling or foot swelling.  Denies any numbness, tingling, or color changes.  He reports he has been told previously that he may have plantar fasciitis.  He denies any trauma or injury to the foot.  No new shoes.  He has not tried anything for his pain.  Reports it is worse in the mornings however does improve with rest and some throughout the day.  Daily tobacco use.  Daily beer use.   Foot Pain Pertinent negatives include no chest pain and no shortness of breath.       Home Medications Prior to Admission medications   Not on File      Allergies    Kiwi extract and Nickel    Review of Systems   Review of Systems  Constitutional:  Negative for chills and fever.  Respiratory:  Negative for shortness of breath.   Cardiovascular:  Negative for chest pain.  Musculoskeletal:        Reports foot pain  Neurological:  Negative for weakness and numbness.    Physical Exam Updated Vital Signs BP (!) 150/97 (BP Location: Right Arm)   Pulse (!) 114   Temp 98.8 F (37.1 C) (Oral)   Resp 18   SpO2 97%  Physical Exam Vitals and nursing note reviewed.  Constitutional:      General: He is not in acute distress.    Appearance: He is not ill-appearing or toxic-appearing.  Eyes:     General: No scleral icterus. Pulmonary:     Effort: Pulmonary effort is normal. No respiratory distress.  Musculoskeletal:        General: Tenderness present.  Feet:      Comments: Left foot - Strong DP and PT pulses. Brisk cap refill present on all 5 toes. No wounds or skin breakdown seen. He does not have any tenderness in the toes, heel, ankle, or lower leg. No swelling noted. He has tenderness to the soft medial arch of the foot. No erythema, fluctuance, induration, or rash present.  No overlying skin changes noted.  Patient is able to plantar and dorsiflex. Compartments are soft.  Skin:    General: Skin is warm and dry.  Neurological:     Mental Status: He is alert.     ED Results / Procedures / Treatments   Labs (all labs ordered are listed, but only abnormal results are displayed) Labs Reviewed - No data to display  EKG None  Radiology DG Foot Complete Left Result Date: 02/09/2024 CLINICAL DATA:  Left foot pain for 3 days without known injury. EXAM: LEFT FOOT - COMPLETE 3+ VIEW COMPARISON:  July 08, 2018. FINDINGS: There is no evidence of fracture or dislocation. There is no evidence of arthropathy or other focal bone abnormality. Soft tissues are unremarkable. IMPRESSION: Negative. Electronically Signed   By: Lupita Raider M.D.   On: 02/09/2024  12:33    Procedures Procedures    Medications Ordered in ED Medications - No data to display  ED Course/ Medical Decision Making/ A&P   {    Medical Decision Making Amount and/or Complexity of Data Reviewed Radiology: ordered.  Risk Prescription drug management.   33 y.o. male presents to the ER for evaluation of foot pain. Differential diagnosis includes but is not limited to MSK, plantar fascitis, bone spur, sprain, strain. Vital signs unremarkable. Physical exam as noted above.   XR of the foot shows negative. Per radiologist's interpretation.    Given the patient's pain and presentation, this seems to be more plantar fascitis in nature. He is neuro-vascularly intact distally. Soft compartments. There is no acute swelling or overlying skin changes indicative of infection. Encouraged  RICE and shoes with good arch support. Information for podiatry given and recommended close follow up. Discussed ibuprofen with RPH Clydie Braun given his G6PD and she reports that this appropriate treatment.   We discussed the results of the labs/imaging. The plan is supportive care, follow up with podiatry. We discussed strict return precautions and red flag symptoms. The patient verbalized their understanding and agrees to the plan. The patient is stable and being discharged home in good condition.  Portions of this report may have been transcribed using voice recognition software. Every effort was made to ensure accuracy; however, inadvertent computerized transcription errors may be present.   Final Clinical Impression(s) / ED Diagnoses Final diagnoses:  Left foot pain    Rx / DC Orders ED Discharge Orders          Ordered    ibuprofen (ADVIL) 600 MG tablet  Every 6 hours PRN        02/09/24 1240              Achille Rich, PA-C 02/09/24 1251    Alvira Monday, MD 02/10/24 1128

## 2024-02-09 NOTE — Discharge Instructions (Addendum)
 You were seen in the ER today for evaluation of your foot pain.  This is likely plantar fasciitis or muscular.  I included more information on plantar fasciitis and foot pain into the discharge paperwork for you to review.  I have also included information on the RICE therapy as well.  Ultimately, you will need to follow-up with a foot specialist.  I included the information for 1 in the discharge paperwork for you to call to follow-up with.  I have also included information for a primary care doctor as well.  Please make sure you call to schedule an appointment.  If you have any concerns, new or worsening symptoms, please return to your nearest emergency department for reevaluation.  For pain, you can take 1000mg  of Tylenol and/ or 600mg  of ibuprofen every 6 hours as needed for pain. Do not take ibuprofen for an extended time given your G6PD. You can also try shoes with good arch support to see if this helps as well!  Contact a health care provider if: Your pain does not get better after a few days of treatment at home. Your pain gets worse. You cannot stand on your foot. Your foot or toes are swollen. Your foot is numb or tingling. Get help right away if: Your foot or toes turn white or blue. You have warmth and redness along your foot.
